# Patient Record
Sex: Male | Born: 1960 | Race: Black or African American | Hispanic: No | Marital: Married | Smoking: Never smoker
Health system: Southern US, Community
[De-identification: ages and names within clinical notes are randomized; demographics above are authoritative.]

## PROBLEM LIST (undated history)

## (undated) DIAGNOSIS — I219 Acute myocardial infarction, unspecified: Secondary | ICD-10-CM

## (undated) DIAGNOSIS — I10 Essential (primary) hypertension: Secondary | ICD-10-CM

## (undated) DIAGNOSIS — K219 Gastro-esophageal reflux disease without esophagitis: Secondary | ICD-10-CM

## (undated) DIAGNOSIS — E7800 Pure hypercholesterolemia, unspecified: Secondary | ICD-10-CM

## (undated) HISTORY — PX: ABDOMINAL SURGERY: SHX537

---

## 2018-11-30 ENCOUNTER — Encounter (HOSPITAL_BASED_OUTPATIENT_CLINIC_OR_DEPARTMENT_OTHER): Payer: Self-pay

## 2018-11-30 ENCOUNTER — Emergency Department (HOSPITAL_BASED_OUTPATIENT_CLINIC_OR_DEPARTMENT_OTHER): Payer: BLUE CROSS/BLUE SHIELD

## 2018-11-30 ENCOUNTER — Observation Stay (HOSPITAL_BASED_OUTPATIENT_CLINIC_OR_DEPARTMENT_OTHER)
Admission: EM | Admit: 2018-11-30 | Discharge: 2018-12-01 | Disposition: A | Discharge status: Home / Self Care | Payer: BLUE CROSS/BLUE SHIELD | Attending: Internal Medicine | Admitting: Internal Medicine

## 2018-11-30 ENCOUNTER — Other Ambulatory Visit: Payer: Self-pay

## 2018-11-30 DIAGNOSIS — I10 Essential (primary) hypertension: Secondary | ICD-10-CM | POA: Insufficient documentation

## 2018-11-30 DIAGNOSIS — E860 Dehydration: Secondary | ICD-10-CM | POA: Diagnosis not present

## 2018-11-30 DIAGNOSIS — I2510 Atherosclerotic heart disease of native coronary artery without angina pectoris: Secondary | ICD-10-CM | POA: Diagnosis not present

## 2018-11-30 DIAGNOSIS — R0602 Shortness of breath: Secondary | ICD-10-CM | POA: Diagnosis present

## 2018-11-30 DIAGNOSIS — K219 Gastro-esophageal reflux disease without esophagitis: Secondary | ICD-10-CM | POA: Insufficient documentation

## 2018-11-30 DIAGNOSIS — E876 Hypokalemia: Secondary | ICD-10-CM | POA: Diagnosis present

## 2018-11-30 DIAGNOSIS — I2583 Coronary atherosclerosis due to lipid rich plaque: Secondary | ICD-10-CM

## 2018-11-30 DIAGNOSIS — E785 Hyperlipidemia, unspecified: Secondary | ICD-10-CM | POA: Diagnosis not present

## 2018-11-30 DIAGNOSIS — N179 Acute kidney failure, unspecified: Secondary | ICD-10-CM | POA: Diagnosis not present

## 2018-11-30 DIAGNOSIS — Z79899 Other long term (current) drug therapy: Secondary | ICD-10-CM | POA: Diagnosis not present

## 2018-11-30 DIAGNOSIS — R6889 Other general symptoms and signs: Secondary | ICD-10-CM

## 2018-11-30 DIAGNOSIS — I252 Old myocardial infarction: Secondary | ICD-10-CM | POA: Diagnosis not present

## 2018-11-30 DIAGNOSIS — E7800 Pure hypercholesterolemia, unspecified: Secondary | ICD-10-CM | POA: Diagnosis not present

## 2018-11-30 HISTORY — DX: Gastro-esophageal reflux disease without esophagitis: K21.9

## 2018-11-30 HISTORY — DX: Acute myocardial infarction, unspecified: I21.9

## 2018-11-30 HISTORY — DX: Essential (primary) hypertension: I10

## 2018-11-30 HISTORY — DX: Pure hypercholesterolemia, unspecified: E78.00

## 2018-11-30 LAB — FERRITIN: Ferritin: 496 ng/mL — ABNORMAL HIGH (ref 24–336)

## 2018-11-30 LAB — COMPREHENSIVE METABOLIC PANEL
ALT: 29 U/L (ref 0–44)
AST: 24 U/L (ref 15–41)
Albumin: 4 g/dL (ref 3.5–5.0)
Alkaline Phosphatase: 74 U/L (ref 38–126)
Anion gap: 12 (ref 5–15)
BUN: 17 mg/dL (ref 6–20)
CO2: 27 mmol/L (ref 22–32)
Calcium: 8.4 mg/dL — ABNORMAL LOW (ref 8.9–10.3)
Chloride: 103 mmol/L (ref 98–111)
Creatinine, Ser: 1.81 mg/dL — ABNORMAL HIGH (ref 0.61–1.24)
GFR calc Af Amer: 48 mL/min — ABNORMAL LOW (ref >60)
GFR calc non Af Amer: 41 mL/min — ABNORMAL LOW (ref >60)
Glucose, Bld: 111 mg/dL — ABNORMAL HIGH (ref 70–99)
Potassium: 2.6 mmol/L — CL (ref 3.5–5.1)
Sodium: 142 mmol/L (ref 135–145)
Total Bilirubin: 0.4 mg/dL (ref 0.3–1.2)
Total Protein: 7.8 g/dL (ref 6.5–8.1)

## 2018-11-30 LAB — CBC
HCT: 39.2 % (ref 39.0–52.0)
HCT: 40.5 % (ref 39.0–52.0)
Hemoglobin: 12.8 g/dL — ABNORMAL LOW (ref 13.0–17.0)
Hemoglobin: 13 g/dL (ref 13.0–17.0)
MCH: 29.2 pg (ref 26.0–34.0)
MCH: 29.5 pg (ref 26.0–34.0)
MCHC: 32.7 g/dL (ref 30.0–36.0)
MCHC: 32.1 g/dL (ref 30.0–36.0)
MCV: 89.5 fL (ref 80.0–100.0)
MCV: 91.8 fL (ref 80.0–100.0)
Platelets: 213 10*3/uL (ref 150–400)
Platelets: 148 10*3/uL — ABNORMAL LOW (ref 150–400)
RBC: 4.38 MIL/uL (ref 4.22–5.81)
RBC: 4.41 MIL/uL (ref 4.22–5.81)
RDW: 12.8 % (ref 11.5–15.5)
RDW: 13 % (ref 11.5–15.5)
WBC: 6.5 10*3/uL (ref 4.0–10.5)
WBC: 7.2 10*3/uL (ref 4.0–10.5)
nRBC: 0 % (ref 0.0–0.2)
nRBC: 0.3 % — ABNORMAL HIGH (ref 0.0–0.2)

## 2018-11-30 LAB — URINALYSIS, ROUTINE W REFLEX MICROSCOPIC
Bilirubin Urine: NEGATIVE
Glucose, UA: NEGATIVE mg/dL
Ketones, ur: NEGATIVE mg/dL
Leukocytes,Ua: NEGATIVE
Nitrite: NEGATIVE
Protein, ur: NEGATIVE mg/dL
Specific Gravity, Urine: 1.01 (ref 1.005–1.030)
pH: 6 (ref 5.0–8.0)

## 2018-11-30 LAB — CREATININE, SERUM
Creatinine, Ser: 1.69 mg/dL — ABNORMAL HIGH (ref 0.61–1.24)
GFR calc Af Amer: 52 mL/min — ABNORMAL LOW (ref >60)
GFR calc non Af Amer: 45 mL/min — ABNORMAL LOW (ref >60)

## 2018-11-30 LAB — LACTATE DEHYDROGENASE: LDH: 235 U/L — ABNORMAL HIGH (ref 98–192)

## 2018-11-30 LAB — CK: Total CK: 234 U/L (ref 49–397)

## 2018-11-30 LAB — C-REACTIVE PROTEIN: CRP: 6.6 mg/dL — ABNORMAL HIGH (ref <1.0)

## 2018-11-30 LAB — CBG MONITORING, ED: Glucose-Capillary: 98 mg/dL (ref 70–99)

## 2018-11-30 LAB — PROCALCITONIN: Procalcitonin: 0.1 ng/mL

## 2018-11-30 LAB — LIPASE, BLOOD: Lipase: 31 U/L (ref 11–51)

## 2018-11-30 LAB — URINALYSIS, MICROSCOPIC (REFLEX)

## 2018-11-30 LAB — BRAIN NATRIURETIC PEPTIDE: B Natriuretic Peptide: 30.5 pg/mL (ref 0.0–100.0)

## 2018-11-30 LAB — TROPONIN I
Troponin I: 0.03 ng/mL (ref <0.03)
Troponin I: 0.03 ng/mL (ref <0.03)

## 2018-11-30 LAB — SEDIMENTATION RATE: Sed Rate: 67 mm/hr — ABNORMAL HIGH (ref 0–16)

## 2018-11-30 MED ORDER — HYDRALAZINE HCL 20 MG/ML IJ SOLN: 10.0000 mg | Freq: Four times a day (QID) | INTRAMUSCULAR | Status: DC | PRN

## 2018-11-30 MED ORDER — POTASSIUM CHLORIDE 10 MEQ/100ML IV SOLN
10.0000 meq | INTRAVENOUS | Status: AC
  Administered 2018-11-30 (×2): 10 meq via INTRAVENOUS
  Filled 2018-11-30 (×3): qty 100

## 2018-11-30 MED ORDER — ACETAMINOPHEN 325 MG PO TABS
650.0000 mg | ORAL_TABLET | Freq: Four times a day (QID) | ORAL | Status: DC | PRN
  Administered 2018-11-30 – 2018-12-01 (×3): 650 mg via ORAL
  Filled 2018-11-30 (×3): qty 2

## 2018-11-30 MED ORDER — POTASSIUM CHLORIDE CRYS ER 20 MEQ PO TBCR
40.0000 meq | EXTENDED_RELEASE_TABLET | Freq: Once | ORAL | Status: AC
  Administered 2018-11-30: 40 meq via ORAL
  Filled 2018-11-30: qty 2

## 2018-11-30 MED ORDER — CLOPIDOGREL BISULFATE 75 MG PO TABS
75.0000 mg | ORAL_TABLET | Freq: Every day | ORAL | Status: DC
  Administered 2018-12-01: 75 mg via ORAL
  Filled 2018-11-30: qty 1

## 2018-11-30 MED ORDER — SODIUM CHLORIDE 0.9 % IV BOLUS
1000.0000 mL | Freq: Once | INTRAVENOUS | Status: AC
  Administered 2018-11-30: 1000 mL via INTRAVENOUS

## 2018-11-30 MED ORDER — AMLODIPINE BESYLATE 10 MG PO TABS
10.0000 mg | ORAL_TABLET | Freq: Every day | ORAL | Status: DC
  Administered 2018-12-01: 10 mg via ORAL
  Filled 2018-11-30: qty 1

## 2018-11-30 MED ORDER — PANTOPRAZOLE SODIUM 40 MG PO TBEC
40.0000 mg | DELAYED_RELEASE_TABLET | Freq: Every day | ORAL | Status: DC
  Administered 2018-12-01: 40 mg via ORAL
  Filled 2018-11-30: qty 1

## 2018-11-30 MED ORDER — POTASSIUM CHLORIDE 10 MEQ/100ML IV SOLN
10.0000 meq | INTRAVENOUS | Status: AC
  Administered 2018-11-30 (×2): 10 meq via INTRAVENOUS
  Filled 2018-11-30: qty 100

## 2018-11-30 MED ORDER — SODIUM CHLORIDE 0.45 % IV SOLN
INTRAVENOUS | Status: DC
  Administered 2018-11-30 – 2018-12-01 (×2): via INTRAVENOUS
  Filled 2018-11-30 (×5): qty 1000

## 2018-11-30 MED ORDER — MAGNESIUM SULFATE 2 GM/50ML IV SOLN
2.0000 g | Freq: Once | INTRAVENOUS | Status: AC
  Administered 2018-11-30: 2 g via INTRAVENOUS
  Filled 2018-11-30: qty 50

## 2018-11-30 MED ORDER — ENOXAPARIN SODIUM 40 MG/0.4ML ~~LOC~~ SOLN
40.0000 mg | SUBCUTANEOUS | Status: DC
  Administered 2018-11-30: 40 mg via SUBCUTANEOUS
  Filled 2018-11-30: qty 0.4

## 2018-11-30 NOTE — Progress Notes (Signed)
58 year old gentleman, with remote history of MI, CAD, hypertension reports having diarrhea 2 weeks ago and since then he has been feeling weak, low energy, dyspnea on exertion, . His labs were remarkable for extremely low potassium of 2.6 and troponin of 0.03 and abnormal EKG.  Request admission for the hypokalemia .  Accepted to telemetry at [facility].   Kathlen Mody, MD [phone]

## 2018-11-30 NOTE — H&P (Signed)
TRH H&P   Patient Demographics:    Tyler Miles, is a 58 y.o. male  MRN: [id number]   DOB - 04/15/1961  Admit Date - 11/30/2018  Outpatient Primary MD for the patient is Kristen LoaderFurr, Tor NettersSara M., MD  Referring MD/NP/PA: [facility]  Patient coming from: home  Chief Complaint  Patient presents with  . Shortness of Breath      HPI:    Tyler Miles  is a 58 y.o. male, with past medical history of CAD, hypertension, hyperlipidemia, presents to ED for multiple complaints, mainly reports fatigue, weakness, out of energy, poor appetite and shortness of breath, reports his symptoms has noted 2 weeks ago, reports this started after he had a milkshake at cookout, reports he had diarrhea for 2 to 3 days after that, reports diarrhea has resolved, no nausea, no vomiting, no abdominal pain, but willing weak, lethargic, and then reported some exertional shortness of breath, no cough, no phlegm, denies fever at home, but reports some chills, no chest pain, dysuria, polyuria, but he does report generalized body ache, lower back pain as well, reports he lifts weight, with no significant change in weight he has been lifting, but is has been feeling sore all over.,  He denies any sick contacts, no recent travel, COVID-19 exposure. - in ED patient creatinine was elevated at 1.8, sodium low at 2.6, troponin borderline 0.03, white blood cell within normal limit, he was noted to have fever when presented to [facility] at 100.2, his EKG showing some T wave abnormalities, but reviewing records and care everywhere he appears to be having some T wave abnormalities in the past, patient was admitted for further work-up    Review of systems:    In addition to the HPI above,  Reports chills, but denies fever, had temperature 100.2 upon presentation to the hospital, reports generalized weakness, fatigue, loss of energy, and  generalized body ache No Headache, No changes with Vision or hearing, No problems swallowing food or Liquids, No Chest pain, Cough, reports mild exertional dyspnea  no Abdominal pain, No Nausea or Vommitting, ports diarrhea before 2 weeks, but nothing since No Blood in stool or Urine, No dysuria, No new skin rashes or bruises, Reports lower back ache and generalized body ache No new weakness, tingling, numbness in any extremity, No recent weight gain or loss, No polyuria, polydypsia or polyphagia, No significant Mental Stressors.  A full 10 point Review of Systems was done, except as stated above, all other Review of Systems were negative.   With Past History of the following :    Past Medical History:  Diagnosis Date  . GERD (gastroesophageal reflux disease)   . Heart attack (HCC)   . High cholesterol   . Hypertension       Past Surgical History:  Procedure Laterality Date  . ABDOMINAL SURGERY        Social  History:     Social History   Tobacco Use  . Smoking status: Never Smoker  . Smokeless tobacco: Never Used  Substance Use Topics  . Alcohol use: Yes    Comment: occ    Is at home, independent   Family History :   Denies any family history of coronary artery disease at young age, no diabetes, no hypertension  Home Medications:   Prior to Admission medications   Medication Sig Start Date End Date Taking? Authorizing Provider  acetaminophen (TYLENOL) 500 MG tablet Take 1,000 mg by mouth every 6 (six) hours as needed for mild pain, moderate pain or headache.   Yes [provider]  atorvastatin (LIPITOR) 80 MG tablet Take 80 mg by mouth daily. 10/11/18  Yes [provider]  clopidogrel (PLAVIX) 75 MG tablet Take 75 mg by mouth daily. 10/11/18  Yes [provider]  losartan (COZAAR) 100 MG tablet Take 50 mg by mouth daily. 08/14/18 08/14/19 Yes [provider]  pantoprazole (PROTONIX) 40 MG tablet Take 40 mg by mouth daily.  10/11/18  Yes [provider]     Allergies:     Allergies  Allergen Reactions  . Aspirin Hives     Physical Exam:   Vitals  Blood pressure (!) 185/102, pulse 64, temperature 100.2 F (37.9 C), temperature source Oral, resp. rate 20, height  (1.803 m), weight 106.6 kg, SpO2 98 %.   1. General developed male, sitting in recliner, in no apparent distress  2. Normal affect and insight, Not Suicidal or Homicidal, Awake Alert, Oriented X 3.  3. No F.N deficits, ALL C.Nerves Intact, Strength 5/5 all 4 extremities, Sensation intact all 4 extremities, Plantars down going.  4. Ears and Eyes appear Normal, Conjunctivae clear, PERRLA. Moist Oral Mucosa.  5. Supple Neck, No JVD, No cervical lymphadenopathy appriciated, No Carotid Bruits.  6. Symmetrical Chest wall movement, Good air movement bilaterally, CTAB.  7. RRR, No Gallops, Rubs or Murmurs, No Parasternal Heave.  8. Positive Bowel Sounds, Abdomen Soft, No tenderness, No organomegaly appriciated,No rebound -guarding or rigidity.  9.  No Cyanosis, Normal Skin Turgor, No Skin Rash or Bruise.  10. Good muscle tone,  joints appear normal , no effusions, Normal ROM.  11. No Palpable Lymph Nodes in Neck or Axillae   Was seen and examined with facemask , gloves and glasses   Data Review:    CBC Recent Labs  Lab 11/30/18 1334  WBC 6.5  HGB 12.8*  HCT 39.2  PLT 213  MCV 89.5  MCH 29.2  MCHC 32.7  RDW 12.8   ------------------------------------------------------------------------------------------------------------------  Chemistries  Recent Labs  Lab 11/30/18 1334  NA 142  K 2.6*  CL 103  CO2 27  GLUCOSE 111*  BUN 17  CREATININE 1.81*  CALCIUM 8.4*  AST 24  ALT 29  ALKPHOS 74  BILITOT 0.4   ------------------------------------------------------------------------------------------------------------------ estimated creatinine clearance is 57.3 mL/min (A) (by C-G formula based on SCr of 1.81  mg/dL (H)). ------------------------------------------------------------------------------------------------------------------ No results for input(s): TSH, T4TOTAL, T3FREE, THYROIDAB in the last 72 hours.  Invalid input(s): FREET3  Coagulation profile No results for input(s): INR, PROTIME in the last 168 hours. ------------------------------------------------------------------------------------------------------------------- No results for input(s): DDIMER in the last 72 hours. -------------------------------------------------------------------------------------------------------------------  Cardiac Enzymes Recent Labs  Lab 11/30/18 1334  TROPONINI 0.03*   ------------------------------------------------------------------------------------------------------------------    Component Value Date/Time   BNP 30.5 11/30/2018 1334     ---------------------------------------------------------------------------------------------------------------  Urinalysis    Component Value Date/Time   COLORURINE  YELLOW 11/30/2018 1334   APPEARANCEUR CLEAR 11/30/2018 1334   LABSPEC 1.010 11/30/2018 1334   PHURINE 6.0 11/30/2018 1334   GLUCOSEU NEGATIVE 11/30/2018 1334   HGBUR TRACE (A) 11/30/2018 1334   BILIRUBINUR NEGATIVE 11/30/2018 1334   KETONESUR NEGATIVE 11/30/2018 1334   PROTEINUR NEGATIVE 11/30/2018 1334   NITRITE NEGATIVE 11/30/2018 1334   LEUKOCYTESUR NEGATIVE 11/30/2018 1334    ----------------------------------------------------------------------------------------------------------------   Imaging Results:    Dg Chest Port 1 View  Result Date: 11/30/2018 CLINICAL DATA:  Weakness and shortness of breath for 1 week. EXAM: PORTABLE CHEST 1 VIEW COMPARISON:  None. FINDINGS: The lungs are clear. Heart size is normal. No pneumothorax or pleural fluid. No acute or focal bony abnormality. IMPRESSION: Negative chest. Electronically Signed   By: Drusilla Kanner M.D.   On: 11/30/2018  14:40    My personal review of EKG: Rhythm NSR, Rate 61  /min, QTc 484, poor to be having abnormal T waves in all leads   Assessment & Plan:    Active Problems:   Hypokalemia   AKI (acute kidney injury) (HCC)   CAD (coronary artery disease)   Hyperlipemia   Essential hypertension   AKI -In the setting of dehydration, volume depletion and poor oral intake, creatinine 1.8, will hold his losartan, avoid nephrotoxic medication, continue with IV fluids and repeat BMP in a.m.  Hypokalemia -Severe, continue with replacement with IV and p.o., he received magnesium as well, will recheck in a.m.  History of CAD -Denies any chest pain, continue  with Plavix, troponin borderline at 0.03, EKG has some changes, but does appear to be chronic, will monitor on telemetry, will cycle troponins.  Hypertension -Blood pressure uncontrolled, will hold losartan secondary to AKI, will start on amlodipine, will keep on PRN hydralazine as well  Hyperlipidemia -Generalized muscle ache, I will hold his statin, will check total CK  GERD -Continue with PPI  Reports subjective dyspnea, not hypoxic, no acute EKG changes, febrile 100.2 on presentation to the long hospital, has generalized body ache, worse fatigue, loss of energy, and diarrhea couple weeks ago, denies any sick contacts for COVID-19, or any history of travel, given his diarrhea, fever, generalized body ache, 19 testing, with CRP, ferritin, procalcitonin, total CK   DVT Prophylaxis Lovenox - SCDs  AM Labs Ordered, also please review Full Orders  Family Communication: Admission, patients condition and plan of care including tests being ordered have been discussed with the patient who indicate understanding and agree with the plan and Code Status.  Code Status Full  Likely DC to  Home  Condition GUARDED   Consults called: None  Admission status: Observation  Time spent in minutes : 55 minues   Huey Bienenstock M.D on 11/30/2018 at 6:03  PM  Between 7am to 7pm - Pager - [phone]. After 7pm go to [email or web address] - password [facility]  [facility] - Office  [phone]

## 2018-11-30 NOTE — ED Notes (Signed)
Patient transported to [facility] in stable condition via [facility].

## 2018-11-30 NOTE — ED Notes (Signed)
Report called to Merlyn Albert, Charity fundraiser at [place].

## 2018-11-30 NOTE — ED Provider Notes (Signed)
[facility] Emergency Department Provider Note MRN:  [id number]  Arrival date & time: 11/30/18     Chief Complaint   Shortness of Breath   History of Present Illness   Tyler Miles is a 58 y.o. year-old male with a history of CAD, hypertension presenting to the ED with chief complaint of shortness of breath.  Patient explains that 2 weeks ago he had a cookout milkshake and it caused him to have 2 to 3 days of diarrhea.  Watery diarrhea with no blood, has since resolved.  He explains that he is never recovered, feeling low energy, "wore out".  Endorsing dyspnea on exertion.  Denies chest pain, no fever, no cough, no abdominal pain, no dysuria, normal bowel movements.  Symptoms are constant, no exacerbating alleviating factors.  Review of Systems  A complete 10 system review of systems was obtained and all systems are negative except as noted in the HPI and PMH.   Patient's Health History    Past Medical History:  Diagnosis Date  . GERD (gastroesophageal reflux disease)   . Heart attack (HCC)   . High cholesterol   . Hypertension     Past Surgical History:  Procedure Laterality Date  . ABDOMINAL SURGERY      No family history on file.  Social History   Socioeconomic History  . Marital status: Married    Spouse name: Not on file  . Number of children: Not on file  . Years of education: Not on file  . Highest education level: Not on file  Occupational History  . Not on file  Social Needs  . Financial resource strain: Not on file  . Food insecurity:    Worry: Not on file    Inability: Not on file  . Transportation needs:    Medical: Not on file    Non-medical: Not on file  Tobacco Use  . Smoking status: Never Smoker  . Smokeless tobacco: Never Used  Substance and Sexual Activity  . Alcohol use: Yes    Comment: occ  . Drug use: Never  . Sexual activity: Not on file  Lifestyle  . Physical activity:    Days per week: Not on file   Minutes per session: Not on file  . Stress: Not on file  Relationships  . Social connections:    Talks on phone: Not on file    Gets together: Not on file    Attends religious service: Not on file    Active member of club or organization: Not on file    Attends meetings of clubs or organizations: Not on file    Relationship status: Not on file  . Intimate partner violence:    Fear of current or ex partner: Not on file    Emotionally abused: Not on file    Physically abused: Not on file    Forced sexual activity: Not on file  Other Topics Concern  . Not on file  Social History Narrative  . Not on file     Physical Exam  Vital Signs and Nursing Notes reviewed Vitals:   11/30/18 1615 11/30/18 1618  BP:  (!) 158/99  Pulse: 62   Resp: 17   Temp:    SpO2: 95%     CONSTITUTIONAL: Well-appearing, NAD NEURO:  Alert and oriented x 3, no focal deficits EYES:  eyes equal and reactive ENT/NECK:  no LAD, no JVD CARDIO: Regular rate, well-perfused, normal S1 and S2 PULM:  CTAB no wheezing  or rhonchi GI/GU:  normal bowel sounds, non-distended, non-tender; large periumbilical scar MSK/SPINE:  No gross deformities, no edema SKIN:  no rash, atraumatic PSYCH:  Appropriate speech and behavior  Diagnostic and Interventional Summary    EKG Interpretation  Date/Time:  Thursday November 30 2018 13:46:51 EDT Ventricular Rate:  61 PR Interval:    QRS Duration: 109 QT Interval:  480 QTC Calculation: 484 R Axis:   41 Text Interpretation:  Sinus rhythm Abnormal R-wave progression, early transition Nonspecific T abnormalities, diffuse leads Borderline prolonged QT interval Confirmed by Kennis Carina [id number]) on 11/30/2018 2:08:20 PM      Labs Reviewed  CBC - Abnormal; Notable for the following components:      Result Value   Hemoglobin 12.8 (*)    All other components within normal limits  COMPREHENSIVE METABOLIC PANEL - Abnormal; Notable for the following components:   Potassium 2.6 (*)     Glucose, Bld 111 (*)    Creatinine, Ser 1.81 (*)    Calcium 8.4 (*)    GFR calc non Af Amer 41 (*)    GFR calc Af Amer 48 (*)    All other components within normal limits  URINALYSIS, ROUTINE W REFLEX MICROSCOPIC - Abnormal; Notable for the following components:   Hgb urine dipstick TRACE (*)    All other components within normal limits  TROPONIN I - Abnormal; Notable for the following components:   Troponin I 0.03 (*)    All other components within normal limits  URINALYSIS, MICROSCOPIC (REFLEX) - Abnormal; Notable for the following components:   Bacteria, UA RARE (*)    All other components within normal limits  LIPASE, BLOOD  BRAIN NATRIURETIC PEPTIDE  CBG MONITORING, ED    DG Chest Port 1 View  Final Result      Medications  potassium chloride 10 mEq in 100 mL IVPB (10 mEq Intravenous Transfusing/Transfer 11/30/18 1617)  magnesium sulfate IVPB 2 g 50 mL (0 g Intravenous Stopped 11/30/18 1612)  potassium chloride SA (K-DUR,KLOR-CON) CR tablet 40 mEq (40 mEq Oral Given 11/30/18 1456)  sodium chloride 0.9 % bolus 1,000 mL (1,000 mLs Intravenous Transfusing/Transfer 11/30/18 1617)     Procedures Critical Care Critical Care Documentation Critical care time provided by me (excluding procedures): 33 minutes  Condition necessitating critical care: Profound hypokalemia  Components of critical care management: reviewing of prior records, laboratory and imaging interpretation, frequent re-examination and reassessment of vital signs, administration of IV potassium, IV magnesium, cardiac monitoring, discussion with consulting services.    ED Course and Medical Decision Making  I have reviewed the triage vital signs and the nursing notes.  Pertinent labs & imaging results that were available during my care of the patient were reviewed by me and considered in my medical decision making (see below for details).  Favoring metabolic disarray, possibly new onset diabetes, less likely but  also considering cardiac etiology of shortness of breath malaise.  Chest x-ray, labs pending.  Work-up reveals hypokalemia, provided with p.o. and IV potassium, IV magnesium, admitted to hospital service for further repletion.  Elmer Sow. Pilar Plate, MD [facility] [email or web address]  Final Clinical Impressions(s) / ED Diagnoses     ICD-10-CM   1. Hypokalemia E87.6   2. SOB (shortness of breath) R06.02 DG Chest [facility] 1 View    DG Chest Port 1 View    CANCELED: DG Chest 2 View    CANCELED: DG Chest 2 View    ED Discharge Orders  None         Sabas SousBero, Estle Huguley M, MD 11/30/18 [phone]

## 2018-11-30 NOTE — ED Triage Notes (Signed)
C/o SOB x 2 weeks-denies cough/fever-diarrhea x 1 day last week-pt NAD-steady gait

## 2018-11-30 NOTE — ED Notes (Signed)
Date and time results receive 11/30/2018 1436 (use smartphrase ".now" to insert current time)  Test: K+ Critical Value: 2.6  Name of Provider Notified: Dr Pilar Plate  Orders Received? Or Actions Taken? none

## 2018-11-30 NOTE — ED Notes (Signed)
Warm blanket given and bed adjusted

## 2018-11-30 NOTE — ED Notes (Signed)
Report to [facility].  

## 2018-11-30 NOTE — ED Notes (Signed)
Date and time results received: 11/30/18 1426 (use smartphrase ".now" to insert current time)  Test: troponin Critical Value: 0.03  Name of Provider Notified: Bero  Orders Received? Or Actions Taken?: MD aware

## 2018-12-01 DIAGNOSIS — I2510 Atherosclerotic heart disease of native coronary artery without angina pectoris: Secondary | ICD-10-CM | POA: Diagnosis not present

## 2018-12-01 DIAGNOSIS — E876 Hypokalemia: Secondary | ICD-10-CM | POA: Diagnosis not present

## 2018-12-01 DIAGNOSIS — N179 Acute kidney failure, unspecified: Secondary | ICD-10-CM | POA: Diagnosis not present

## 2018-12-01 DIAGNOSIS — I2583 Coronary atherosclerosis due to lipid rich plaque: Secondary | ICD-10-CM | POA: Diagnosis not present

## 2018-12-01 LAB — COMPREHENSIVE METABOLIC PANEL
ALT: 24 U/L (ref 0–44)
AST: 21 U/L (ref 15–41)
Albumin: 3.9 g/dL (ref 3.5–5.0)
Alkaline Phosphatase: 68 U/L (ref 38–126)
Anion gap: 11 (ref 5–15)
BUN: 12 mg/dL (ref 6–20)
CO2: 26 mmol/L (ref 22–32)
Calcium: 8.3 mg/dL — ABNORMAL LOW (ref 8.9–10.3)
Chloride: 105 mmol/L (ref 98–111)
Creatinine, Ser: 1.31 mg/dL — ABNORMAL HIGH (ref 0.61–1.24)
GFR calc Af Amer: >60 mL/min (ref >60)
GFR calc non Af Amer: >60 mL/min (ref >60)
Glucose, Bld: 121 mg/dL — ABNORMAL HIGH (ref 70–99)
Potassium: 3 mmol/L — ABNORMAL LOW (ref 3.5–5.1)
Sodium: 142 mmol/L (ref 135–145)
Total Bilirubin: 0.4 mg/dL (ref 0.3–1.2)
Total Protein: 7.4 g/dL (ref 6.5–8.1)

## 2018-12-01 LAB — CBC WITH DIFFERENTIAL/PLATELET
Abs Immature Granulocytes: 0.02 10*3/uL (ref 0.00–0.07)
Basophils Absolute: 0 10*3/uL (ref 0.0–0.1)
Basophils Relative: 0 %
Eosinophils Absolute: 0 10*3/uL (ref 0.0–0.5)
Eosinophils Relative: 0 %
HCT: 38.4 % — ABNORMAL LOW (ref 39.0–52.0)
Hemoglobin: 12.4 g/dL — ABNORMAL LOW (ref 13.0–17.0)
Immature Granulocytes: 0 %
Lymphocytes Relative: 29 %
Lymphs Abs: 1.9 10*3/uL (ref 0.7–4.0)
MCH: 29.3 pg (ref 26.0–34.0)
MCHC: 32.3 g/dL (ref 30.0–36.0)
MCV: 90.8 fL (ref 80.0–100.0)
Monocytes Absolute: 0.8 10*3/uL (ref 0.1–1.0)
Monocytes Relative: 12 %
Neutro Abs: 3.8 10*3/uL (ref 1.7–7.7)
Neutrophils Relative %: 59 %
Platelets: 209 10*3/uL (ref 150–400)
RBC: 4.23 MIL/uL (ref 4.22–5.81)
RDW: 12.9 % (ref 11.5–15.5)
WBC: 6.4 10*3/uL (ref 4.0–10.5)
nRBC: 0 % (ref 0.0–0.2)

## 2018-12-01 LAB — TROPONIN I
Troponin I: 0.03 ng/mL (ref <0.03)
Troponin I: <0.03 ng/mL (ref <0.03)

## 2018-12-01 LAB — C-REACTIVE PROTEIN: CRP: 8.1 mg/dL — ABNORMAL HIGH (ref <1.0)

## 2018-12-01 LAB — CK: Total CK: 220 U/L (ref 49–397)

## 2018-12-01 LAB — MAGNESIUM: Magnesium: 1.9 mg/dL (ref 1.7–2.4)

## 2018-12-01 MED ORDER — MAGNESIUM SULFATE 2 GM/50ML IV SOLN
2.0000 g | Freq: Once | INTRAVENOUS | Status: AC
  Administered 2018-12-01: 2 g via INTRAVENOUS
  Filled 2018-12-01: qty 50

## 2018-12-01 MED ORDER — POTASSIUM CHLORIDE CRYS ER 20 MEQ PO TBCR
40.0000 meq | EXTENDED_RELEASE_TABLET | Freq: Four times a day (QID) | ORAL | Status: AC
  Administered 2018-12-01 (×2): 40 meq via ORAL
  Filled 2018-12-01 (×2): qty 2

## 2018-12-01 MED ORDER — POTASSIUM CHLORIDE ER 10 MEQ PO TBCR: 20.0000 meq | EXTENDED_RELEASE_TABLET | Freq: Every day | ORAL | 0 refills | Status: AC

## 2018-12-01 MED ORDER — VITAMIN C 250 MG PO TABS: 250.0000 mg | ORAL_TABLET | Freq: Three times a day (TID) | ORAL | 0 refills | Status: AC

## 2018-12-01 MED ORDER — ZINC 100 MG PO TABS: 100.0000 mg | ORAL_TABLET | Freq: Two times a day (BID) | ORAL | 0 refills | Status: AC

## 2018-12-01 MED ORDER — POTASSIUM CHLORIDE CRYS ER 20 MEQ PO TBCR
20.0000 meq | EXTENDED_RELEASE_TABLET | Freq: Once | ORAL | Status: AC
  Administered 2018-12-01: 20 meq via ORAL
  Filled 2018-12-01: qty 1

## 2018-12-01 MED ORDER — AMLODIPINE BESYLATE 10 MG PO TABS: 10.0000 mg | ORAL_TABLET | Freq: Every day | ORAL | 0 refills | Status: AC

## 2018-12-01 NOTE — Progress Notes (Signed)
At 1545, the pt was provided with d/c instructions. The pt was educated regarding COVID-19 and advised to self isolate. After discussing the pt's plan of care upon d/c home, the pt reported no further questions or concerns.

## 2018-12-01 NOTE — Discharge Summary (Addendum)
Tyler Miles, is a 58 y.o. male  DOB [birth year]  MRN [id number].  Admission date:  11/30/2018  Admitting Physician  Kathlen Mody, MD  Discharge Date:  12/01/2018   Primary MD  Laqueta Due., MD  Recommendations for primary care physician for things to follow:  -Check CBC, BMP during next visit -Please follow on the final results of COVID-19 testing sent on 11/30/2018 -Patient upon discharge was given [facility] Department of Health and [employer] guidelines and recommendation for infection prevention recommendation for patient is being evaluated for 2019 [place] coronavirus to receive cares at home, he was instructed to comply with a   Admission Diagnosis  Hypokalemia [E87.6] SOB (shortness of breath) [R06.02]   Discharge Diagnosis  Hypokalemia [E87.6] SOB (shortness of breath) [R06.02]    Active Problems:   Hypokalemia   AKI (acute kidney injury) (HCC)   CAD (coronary artery disease)   Hyperlipemia   Essential hypertension      Past Medical History:  Diagnosis Date   GERD (gastroesophageal reflux disease)    Heart attack (HCC)    High cholesterol    Hypertension     Past Surgical History:  Procedure Laterality Date   ABDOMINAL SURGERY         History of present illness and  Hospital Course:     Kindly see H&P for history of present illness and admission details, please review complete Labs, Consult reports and Test reports for all details in brief  HPI  from the history and physical done on the day of admission 11/30/2018  Tyler Miles  is a 58 y.o. male, with past medical history of CAD, hypertension, hyperlipidemia, presents to ED for multiple complaints, mainly reports fatigue, weakness, out of energy, poor appetite and shortness of breath, reports his symptoms has noted 2 weeks ago, reports this started after he had a milkshake at cookout, reports he had diarrhea for  2 to 3 days after that, reports diarrhea has resolved, no nausea, no vomiting, no abdominal pain, but willing weak, lethargic, and then reported some exertional shortness of breath, no cough, no phlegm, denies fever at home, but reports some chills, no chest pain, dysuria, polyuria, but he does report generalized body ache, lower back pain as well, reports he lifts weight, with no significant change in weight he has been lifting, but is has been feeling sore all over.,  He denies any sick contacts, no recent travel, COVID-19 exposure. - in ED patient creatinine was elevated at 1.8, sodium low at 2.6, troponin borderline 0.03, white blood cell within normal limit, he was noted to have fever when presented to [facility] long at 100.2, his EKG showing some T wave abnormalities, but reviewing records and care everywhere he appears to be having some T wave abnormalities in the past, patient was admitted for further work-up   Hospital Course   Addendum 12/04/2018 at 4 PM COVID 19 infection -Patient COVID-19 test came back positive, I have called patient, notified him about the results, discussed with him about  self isolation recommendations which he has been given upon his discharge, reports he has been self isolating, reports he is currently feeling well, with no symptoms.   AKI -In the setting of dehydration, volume depletion and poor oral intake, creatinine 1.8 on admission, he received IV fluids during hospital stay, creatinine 1.3 on discharge, instructed to increase his fluid intake, and I have stopped losartan on discharge, to follow with PCP in 1 to 2 weeks regarding repeat labs.  Hypokalemia -Severe, 2.6 on presentation, was repleted overnight, potassium was 3 before discharge, so he did receive oral and IV supplements before discharge as well, and he will be discharged on another week of oral supplement.   History of CAD -Denies any chest pain, continue  with Plavix, troponin borderline at 0.03,  EKG has some T wave abnormalities, but does appear to be chronic, significant events on telemetry, troponins flat, non-ACS pattern.  Hypertension -Blood pressure uncontrolled on admission, good on Norvasc 10 mg oral daily, with overall acceptable blood pressure, losartan has been stopped in the setting of AKI.  Hyperlipidemia -Continue with home dose statin on discharge  GERD -Continue with PPI  COVID-19 rule out - Reports subjective dyspnea, febrile 100.2 on presentation, reports fatigue, loss of energy, had diarrhea before 2 weeks, denies any sick contacts, or COVID-19 exposure, or any recent travel, with 19 was sent on admission, results are pending on discharge, I have instructed the patient [facility] health department guidelines about isolating COVID-19 tests is pending . -CRP and ferritin was elevated, procalcitonin within normal limits, COVID 19 test pending at time of discharge  Discharge Condition:  stable   Follow UP  Follow-up Information    Laqueta Due., MD Follow up in 1 week(s).   Specialty:  Internal Medicine Contact information: [address] [place] [address] [phone]             Discharge Instructions  and  Discharge Medications     Discharge Instructions    Discharge instructions   Complete by:  As directed    Follow with Primary MD Laqueta Due., MD in 7 days   Get CBC, CMP, 2 view Chest X ray checked  by Primary MD next visit.    Activity: As tolerated with Full fall precautions use walker/cane & assistance as needed   Disposition Home    Diet: Heart Healthy  , with feeding assistance and aspiration precautions.   On your next visit with your primary care physician please Get Medicines reviewed and adjusted.   Please request your Prim.MD to go over all Hospital Tests and Procedure/Radiological results at the follow up, please get all Hospital records sent to your Prim MD by signing hospital release before  you go home.   If you experience worsening of your admission symptoms, develop shortness of breath, life threatening emergency, suicidal or homicidal thoughts you must seek medical attention immediately by calling 911 or calling your MD immediately  if symptoms less severe.  You Must read complete instructions/literature along with all the possible adverse reactions/side effects for all the Medicines you take and that have been prescribed to you. Take any new Medicines after you have completely understood and accpet all the possible adverse reactions/side effects.   Do not drive, operating heavy machinery, perform activities at heights, swimming or participation in water activities or provide baby sitting services if your were admitted for syncope or siezures until you have seen by Primary MD or a Neurologist and  advised to do so again.  Do not drive when taking Pain medications.    Do not take more than prescribed Pain, Sleep and Anxiety Medications  Special Instructions: If you have smoked or chewed Tobacco  in the last 2 yrs please stop smoking, stop any regular Alcohol  and or any Recreational drug use.  Wear Seat belts while driving.   Please note  You were cared for by a hospitalist during your hospital stay. If you have any questions about your discharge medications or the care you received while you were in the hospital after you are discharged, you can call the unit and asked to speak with the hospitalist on call if the hospitalist that took care of you is not available. Once you are discharged, your primary care physician will handle any further medical issues. Please note that NO REFILLS for any discharge medications will be authorized once you are discharged, as it is imperative that you return to your primary care physician (or establish a relationship with a primary care physician if you do not have one) for your aftercare needs so that they can reassess your need for medications  and monitor your lab values.   Increase activity slowly   Complete by:  As directed      Allergies as of 12/01/2018      Reactions   Aspirin Hives      Medication List    STOP taking these medications   losartan 100 MG tablet Commonly known as:  COZAAR     TAKE these medications   acetaminophen 500 MG tablet Commonly known as:  TYLENOL Take 1,000 mg by mouth every 6 (six) hours as needed for mild pain, moderate pain or headache.   amLODipine 10 MG tablet Commonly known as:  NORVASC Take 1 tablet (10 mg total) by mouth daily. Start taking on:  December 02, 2018   atorvastatin 80 MG tablet Commonly known as:  LIPITOR Take 80 mg by mouth daily.   clopidogrel 75 MG tablet Commonly known as:  PLAVIX Take 75 mg by mouth daily.   pantoprazole 40 MG tablet Commonly known as:  PROTONIX Take 40 mg by mouth daily.   potassium chloride 10 MEQ tablet Commonly known as:  K-DUR Take 2 tablets (20 mEq total) by mouth daily for 7 days.   vitamin C 250 MG tablet Commonly known as:  ASCORBIC ACID Take 1 tablet (250 mg total) by mouth 3 (three) times daily.   Zinc 100 MG Tabs Take 1 tablet (100 mg total) by mouth 2 (two) times daily.         Diet and Activity recommendation: See Discharge Instructions above   Consults obtained -  None   Major procedures and Radiology Reports - PLEASE review detailed and final reports for all details, in brief -      Dg Chest Port 1 View  Result Date: 11/30/2018 CLINICAL DATA:  Weakness and shortness of breath for 1 week. EXAM: PORTABLE CHEST 1 VIEW COMPARISON:  None. FINDINGS: The lungs are clear. Heart size is normal. No pneumothorax or pleural fluid. No acute or focal bony abnormality. IMPRESSION: Negative chest. Electronically Signed   By: Drusilla Kanner M.D.   On: 11/30/2018 14:40    Micro Results    Recent Results (from the past 240 hour(s))  Culture, blood (Routine X 2) w Reflex to ID Panel     Status: None (Preliminary  result)   Collection Time: 11/30/18  6:28 PM  Result Value  Ref Range Status   Specimen Description   Final    BLOOD RIGHT ANTECUBITAL Performed at [facility], [address] [place], [place] [address]    Special Requests   Final    BOTTLES DRAWN AEROBIC ONLY Blood Culture adequate volume Performed at [facility], [address] [place], [place] [address]    Culture   Final    NO GROWTH < 24 HOURS Performed at [facility], [address] [place], [place] [address]    Report Status PENDING  Incomplete  Culture, blood (Routine X 2) w Reflex to ID Panel     Status: None (Preliminary result)   Collection Time: 11/30/18  6:28 PM  Result Value Ref Range Status   Specimen Description   Final    BLOOD RIGHT WRIST Performed at [facility], [address] [place], [place] [address]    Special Requests   Final    BOTTLES DRAWN AEROBIC ONLY Blood Culture results may not be optimal due to an inadequate volume of blood received in culture bottles Performed at [facility], [address] [place], [place] [address]    Culture   Final    NO GROWTH < 24 HOURS Performed at [facility], [address] [place], [place] [address]    Report Status PENDING  Incomplete       Today   Subjective:   Tarri GlennVictor Canavan today has no headache,no chest abdominal pain,no new weakness tingling or numbness, reports he is feeling much better today, dyspnea has improved, and is saturating 98% on room air, reports good appetite, oral intake, he is eager to go home today.  Objective:   Blood pressure (!) 142/87, pulse 69, temperature 99.7 F (37.6 C), temperature source Oral, resp. rate 17, height 5\' 11"  (1.803 m), weight 106.1 kg, SpO2 98 %.   Intake/Output Summary (Last 24 hours) at 12/01/2018 1439 Last data filed at 12/01/2018 1400 Gross per 24 hour  Intake 2495.21 ml  Output --  Net 2495.21 ml    Exam Awake  Alert, Oriented x 3, No new F.N deficits, Normal affect  Symmetrical Chest wall movement, Good air movement bilaterally, CTAB RRR,No Gallops,Rubs or new Murmurs, No Parasternal Heave +ve B.Sounds, Abd Soft, Non tender,  No rebound -guarding or rigidity. No Cyanosis, Clubbing or edema, No new Rash or bruise  Isolation :droplets and contact,  - PPE on exam today:   CAPR, gown, head covere and gloves  Data Review   CBC w Diff:  Lab Results  Component Value Date   WBC 6.4 12/01/2018   HGB 12.4 (L) 12/01/2018   HCT 38.4 (L) 12/01/2018   PLT 209 12/01/2018   LYMPHOPCT 29 12/01/2018   MONOPCT 12 12/01/2018   EOSPCT 0 12/01/2018   BASOPCT 0 12/01/2018    CMP:  Lab Results  Component Value Date   NA 142 12/01/2018   K 3.0 (L) 12/01/2018   CL 105 12/01/2018   CO2 26 12/01/2018   BUN 12 12/01/2018   CREATININE 1.31 (H) 12/01/2018   PROT 7.4 12/01/2018   ALBUMIN 3.9 12/01/2018   BILITOT 0.4 12/01/2018   ALKPHOS 68 12/01/2018   AST 21 12/01/2018   ALT 24 12/01/2018  .   Total Time in preparing paper work, data evaluation and todays exam - 35 minutes  Huey Bienenstockawood Raidon Swanner M.D on 12/01/2018 at 2:39 PM  [facility]   Office  [phone]

## 2018-12-01 NOTE — Discharge Instructions (Signed)
Person Under Monitoring Name: Tyler Miles  Location: [address] [place] [address]   Infection Prevention Recommendations for Individuals Confirmed to have, or Being Evaluated for, 2019 Novel Coronavirus (COVID-19) Infection Who Receive Care at Home  Individuals who are confirmed to have, or are being evaluated for, COVID-19 should follow the prevention steps below until a healthcare provider or local or state health department says they can return to normal activities.  Stay home except to get medical care You should restrict activities outside your home, except for getting medical care. Do not go to work, school, or public areas, and do not use public transportation or taxis.  Call ahead before visiting your doctor Before your medical appointment, call the healthcare provider and tell them that you have, or are being evaluated for, COVID-19 infection. This will help the healthcare providers office take steps to keep other people from getting infected. Ask your healthcare provider to call the local or state health department.  Monitor your symptoms Seek prompt medical attention if your illness is worsening (e.g., difficulty breathing). Before going to your medical appointment, call the healthcare provider and tell them that you have, or are being evaluated for, COVID-19 infection. Ask your healthcare provider to call the local or state health department.  Wear a facemask You should wear a facemask that covers your nose and mouth when you are in the same room with other people and when you visit a healthcare provider. People who live with or visit you should also wear a facemask while they are in the same room with you.  Separate yourself from other people in your home As much as possible, you should stay in a different room from other people in your home. Also, you should use a separate bathroom, if available.  Avoid sharing household items You should not  share dishes, drinking glasses, cups, eating utensils, towels, bedding, or other items with other people in your home. After using these items, you should wash them thoroughly with soap and water.  Cover your coughs and sneezes Cover your mouth and nose with a tissue when you cough or sneeze, or you can cough or sneeze into your sleeve. Throw used tissues in a lined trash can, and immediately wash your hands with soap and water for at least 20 seconds or use an alcohol-based hand rub.  Wash your [employer] your hands often and thoroughly with soap and water for at least 20 seconds. You can use an alcohol-based hand sanitizer if soap and water are not available and if your hands are not visibly dirty. Avoid touching your eyes, nose, and mouth with unwashed hands.   Prevention Steps for Caregivers and Household Members of Individuals Confirmed to have, or Being Evaluated for, COVID-19 Infection Being Cared for in the Home  If you live with, or provide care at home for, a person confirmed to have, or being evaluated for, COVID-19 infection please follow these guidelines to prevent infection:  Follow healthcare providers instructions Make sure that you understand and can help the patient follow any healthcare provider instructions for all care.  Provide for the patients basic needs You should help the patient with basic needs in the home and provide support for getting groceries, prescriptions, and other personal needs.  Monitor the patients symptoms If they are getting sicker, call his or her medical provider and tell them that the patient has, or is being evaluated for, COVID-19 infection. This will help the healthcare providers  office take steps to keep other people from getting infected. Ask the healthcare provider to call the local or state health department.  Limit the number of people who have contact with the patient  If possible, have only one caregiver for the  patient.  Other household members should stay in another home or place of residence. If this is not possible, they should stay  in another room, or be separated from the patient as much as possible. Use a separate bathroom, if available.  Restrict visitors who do not have an essential need to be in the home.  Keep older adults, very young children, and other sick people away from the patient Keep older adults, very young children, and those who have compromised immune systems or chronic health conditions away from the patient. This includes people with chronic heart, lung, or kidney conditions, diabetes, and cancer.  Ensure good ventilation Make sure that shared spaces in the home have good air flow, such as from an air conditioner or an opened window, weather permitting.  Wash your hands often  Wash your hands often and thoroughly with soap and water for at least 20 seconds. You can use an alcohol based hand sanitizer if soap and water are not available and if your hands are not visibly dirty.  Avoid touching your eyes, nose, and mouth with unwashed hands.  Use disposable paper towels to dry your hands. If not available, use dedicated cloth towels and replace them when they become wet.  Wear a facemask and gloves  Wear a disposable facemask at all times in the room and gloves when you touch or have contact with the patients blood, body fluids, and/or secretions or excretions, such as sweat, saliva, sputum, nasal mucus, vomit, urine, or feces.  Ensure the mask fits over your nose and mouth tightly, and do not touch it during use.  Throw out disposable facemasks and gloves after using them. Do not reuse.  Wash your hands immediately after removing your facemask and gloves.  If your personal clothing becomes contaminated, carefully remove clothing and launder. Wash your hands after handling contaminated clothing.  Place all used disposable facemasks, gloves, and other waste in a lined  container before disposing them with other household waste.  Remove gloves and wash your hands immediately after handling these items.  Do not share dishes, glasses, or other household items with the patient  Avoid sharing household items. You should not share dishes, drinking glasses, cups, eating utensils, towels, bedding, or other items with a patient who is confirmed to have, or being evaluated for, COVID-19 infection.  After the person uses these items, you should wash them thoroughly with soap and water.  Wash laundry thoroughly  Immediately remove and wash clothes or bedding that have blood, body fluids, and/or secretions or excretions, such as sweat, saliva, sputum, nasal mucus, vomit, urine, or feces, on them.  Wear gloves when handling laundry from the patient.  Read and follow directions on labels of laundry or clothing items and detergent. In general, wash and dry with the warmest temperatures recommended on the label.  Clean all areas the individual has used often  Clean all touchable surfaces, such as counters, tabletops, doorknobs, bathroom fixtures, toilets, phones, keyboards, tablets, and bedside tables, every day. Also, clean any surfaces that may have blood, body fluids, and/or secretions or excretions on them.  Wear gloves when cleaning surfaces the patient has come in contact with.  Use a diluted bleach solution (e.g., dilute bleach with 1  part bleach and 10 parts water) or a household disinfectant with a label that says EPA-registered for coronaviruses. To make a bleach solution at home, add 1 tablespoon of bleach to 1 quart (4 cups) of water. For a larger supply, add  cup of bleach to 1 gallon (16 cups) of water.  Read labels of cleaning products and follow recommendations provided on product labels. Labels contain instructions for safe and effective use of the cleaning product including precautions you should take when applying the product, such as wearing gloves or  eye protection and making sure you have good ventilation during use of the product.  Remove gloves and wash hands immediately after cleaning.  Monitor yourself for signs and symptoms of illness Caregivers and household members are considered close contacts, should monitor their health, and will be asked to limit movement outside of the home to the extent possible. Follow the monitoring steps for close contacts listed on the symptom monitoring form.   ? If you have additional questions, contact your local health department or call the epidemiologist on call at [phone] (available 24/7). ? This guidance is subject to change. For the most up-to-date guidance from [facility], please refer to their website: [email or web address]  Follow with Primary MD Kristen Loader Tor Netters., MD in 7 days   Get CBC, CMP, 2 view Chest X ray checked  by Primary MD next visit.    Activity: As tolerated with Full fall precautions use walker/cane & assistance as needed   Disposition Home    Diet: Heart Healthy  , with feeding assistance and aspiration precautions.   On your next visit with your primary care physician please Get Medicines reviewed and adjusted.   Please request your Prim.MD to go over all Hospital Tests and Procedure/Radiological results at the follow up, please get all Hospital records sent to your Prim MD by signing hospital release before you go home.   If you experience worsening of your admission symptoms, develop shortness of breath, life threatening emergency, suicidal or homicidal thoughts you must seek medical attention immediately by calling 911 or calling your MD immediately  if symptoms less severe.  You Must read complete instructions/literature along with all the possible adverse reactions/side effects for all the Medicines you take and that have been prescribed to you. Take any new Medicines after you have completely understood and  accpet all the possible adverse reactions/side effects.   Do not drive, operating heavy machinery, perform activities at heights, swimming or participation in water activities or provide baby sitting services if your were admitted for syncope or siezures until you have seen by Primary MD or a Neurologist and advised to do so again.  Do not drive when taking Pain medications.    Do not take more than prescribed Pain, Sleep and Anxiety Medications  Special Instructions: If you have smoked or chewed Tobacco  in the last 2 yrs please stop smoking, stop any regular Alcohol  and or any Recreational drug use.  Wear Seat belts while driving.   Please note  You were cared for by a hospitalist during your hospital stay. If you have any questions about your discharge medications or the care you received while you were in the hospital after you are discharged, you can call the unit and asked to speak with the hospitalist on call if the hospitalist that took care of you is not available. Once you are discharged, your primary care physician will handle any further medical issues. Please note that NO REFILLS for  any discharge medications will be authorized once you are discharged, as it is imperative that you return to your primary care physician (or establish a relationship with a primary care physician if you do not have one) for your aftercare needs so that they can reassess your need for medications and monitor your lab values.

## 2018-12-04 LAB — NOVEL CORONAVIRUS, NAA (HOSP ORDER, SEND-OUT TO REF LAB; TAT 18-24 HRS): SARS-CoV-2, NAA: DETECTED — AB

## 2018-12-04 NOTE — Progress Notes (Signed)
Received call from lab stating that patients COVID 19 test came back positive. Called patient and informed him of positive test result. Patient stated that he has been self isolating and has not had any symptoms since discharge. Reviewed importance of self isolation. Patient verbalized understanding.

## 2018-12-05 LAB — CULTURE, BLOOD (ROUTINE X 2)
Culture: NO GROWTH
Culture: NO GROWTH
Special Requests: ADEQUATE

## 2020-10-17 IMAGING — DX PORTABLE CHEST - 1 VIEW
1 series · 1 of 1 positions shown · non-contrast
Comparison: None.

CLINICAL DATA: Weakness and shortness of breath for 1 week.

EXAM:
PORTABLE CHEST 1 VIEW

[chest ap]
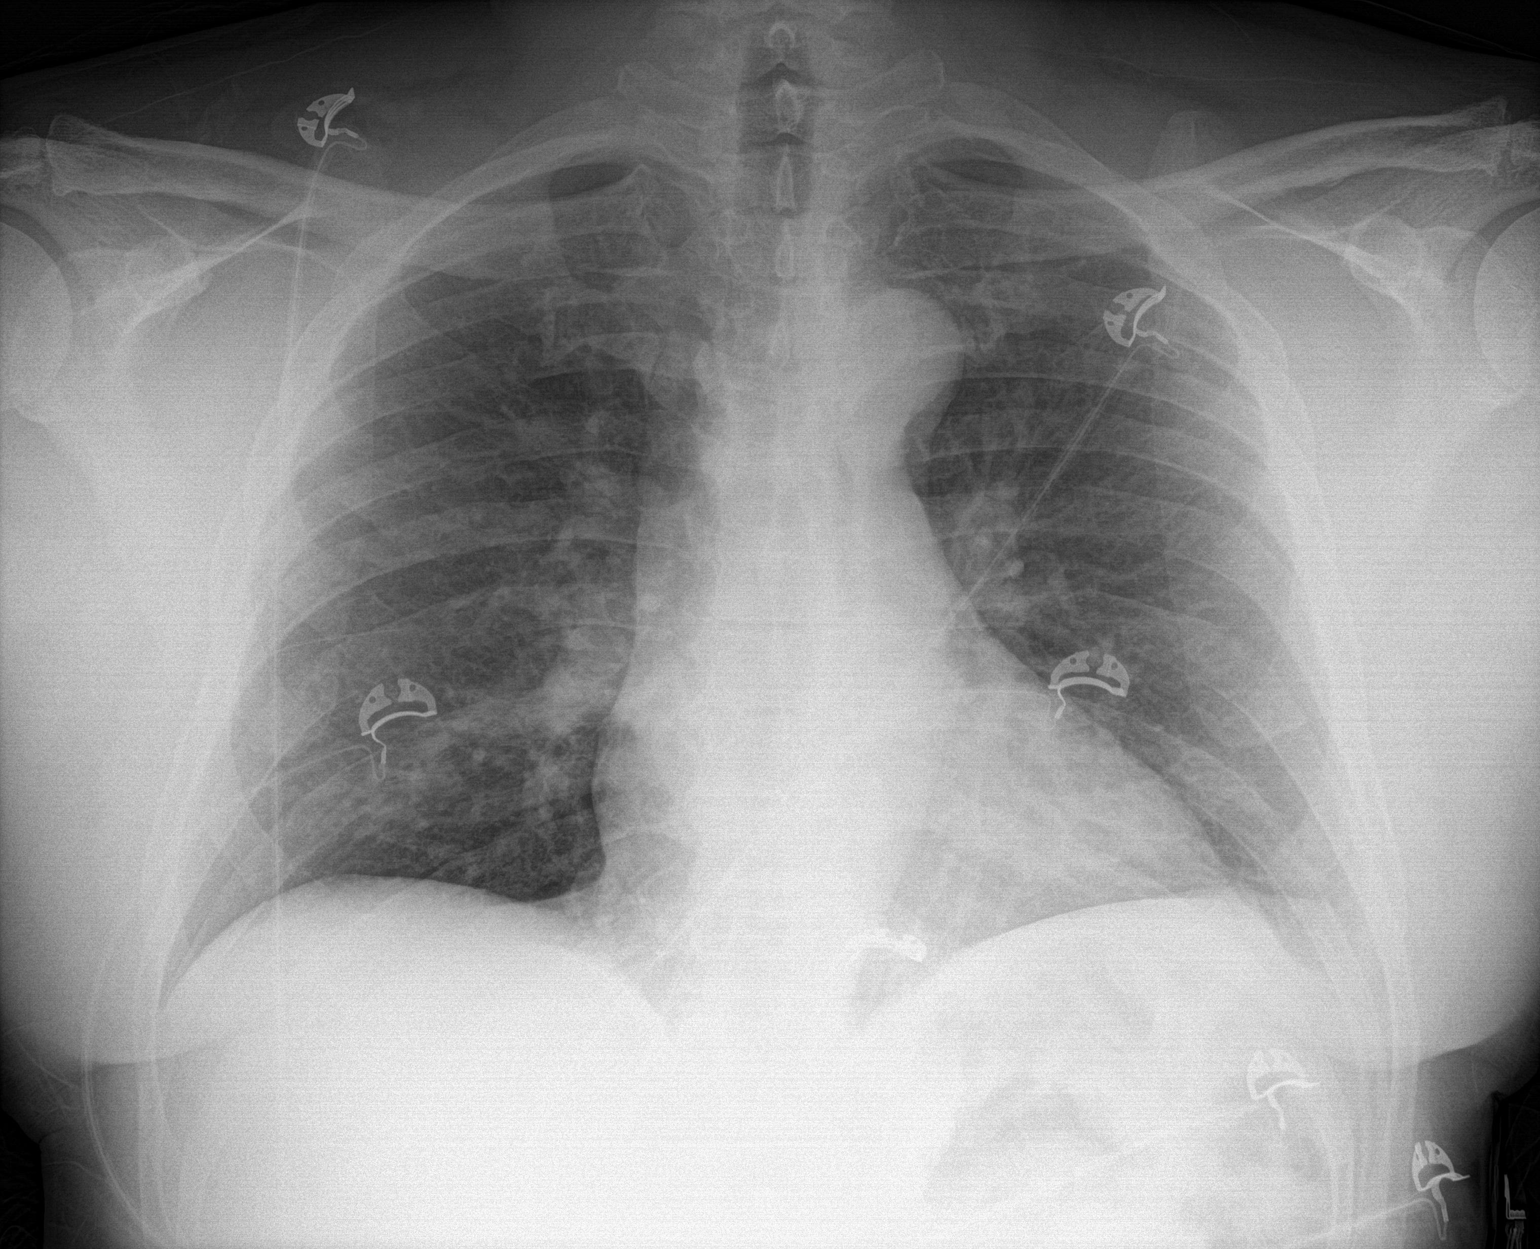

[1 of 1 positions shown; findings below may reference images not displayed]

FINDINGS: The lungs are clear. Heart size is normal. No pneumothorax or
pleural fluid. No acute or focal bony abnormality.
IMPRESSION: Negative chest.
